# Patient Record
Sex: Female | Born: 1960 | Race: White | Hispanic: No
Health system: Southern US, Community
[De-identification: ages and names within clinical notes are randomized; demographics above are authoritative.]

---

## 2004-02-11 ENCOUNTER — Other Ambulatory Visit: Payer: Self-pay

## 2006-11-04 ENCOUNTER — Emergency Department: Payer: Self-pay | Admitting: Internal Medicine

## 2006-11-12 ENCOUNTER — Ambulatory Visit: Payer: Self-pay | Admitting: Internal Medicine

## 2006-12-03 ENCOUNTER — Inpatient Hospital Stay: Payer: Self-pay | Admitting: Internal Medicine

## 2006-12-03 ENCOUNTER — Other Ambulatory Visit: Payer: Self-pay

## 2006-12-12 ENCOUNTER — Ambulatory Visit: Payer: Self-pay | Admitting: Internal Medicine

## 2007-03-31 ENCOUNTER — Emergency Department: Payer: Self-pay | Admitting: Emergency Medicine

## 2007-05-04 ENCOUNTER — Inpatient Hospital Stay: Payer: Self-pay | Admitting: Internal Medicine

## 2007-05-25 ENCOUNTER — Observation Stay: Payer: Self-pay | Admitting: Internal Medicine

## 2007-05-25 ENCOUNTER — Other Ambulatory Visit: Payer: Self-pay

## 2007-05-30 ENCOUNTER — Emergency Department: Payer: Self-pay | Admitting: Emergency Medicine

## 2007-05-31 ENCOUNTER — Other Ambulatory Visit: Payer: Self-pay

## 2007-05-31 ENCOUNTER — Emergency Department: Payer: Self-pay | Admitting: Internal Medicine

## 2007-07-11 ENCOUNTER — Inpatient Hospital Stay: Payer: Self-pay | Admitting: Psychiatry

## 2007-08-03 ENCOUNTER — Other Ambulatory Visit: Payer: Self-pay

## 2007-08-03 ENCOUNTER — Emergency Department: Payer: Self-pay | Admitting: Emergency Medicine

## 2008-06-13 ENCOUNTER — Emergency Department: Payer: Self-pay | Admitting: Emergency Medicine

## 2008-07-25 ENCOUNTER — Emergency Department: Payer: Self-pay | Admitting: Emergency Medicine

## 2008-08-20 ENCOUNTER — Inpatient Hospital Stay: Payer: Self-pay | Admitting: Psychiatry

## 2008-11-05 ENCOUNTER — Emergency Department: Payer: Self-pay | Admitting: Internal Medicine

## 2009-08-17 ENCOUNTER — Ambulatory Visit: Payer: Self-pay | Admitting: Gastroenterology

## 2009-08-20 ENCOUNTER — Emergency Department (HOSPITAL_COMMUNITY): Admission: EM | Admit: 2009-08-20 | Discharge: 2009-08-20 | Payer: Self-pay | Admitting: Emergency Medicine

## 2009-10-03 ENCOUNTER — Inpatient Hospital Stay: Payer: Self-pay | Admitting: Psychiatry

## 2009-10-16 ENCOUNTER — Inpatient Hospital Stay: Payer: Self-pay | Admitting: Psychiatry

## 2009-10-23 ENCOUNTER — Ambulatory Visit: Payer: Self-pay | Admitting: Unknown Physician Specialty

## 2009-11-06 ENCOUNTER — Inpatient Hospital Stay: Payer: Self-pay | Admitting: Psychiatry

## 2009-11-13 ENCOUNTER — Ambulatory Visit: Payer: Self-pay | Admitting: Unknown Physician Specialty

## 2009-12-06 ENCOUNTER — Emergency Department: Payer: Self-pay | Admitting: Emergency Medicine

## 2009-12-22 ENCOUNTER — Emergency Department: Payer: Self-pay | Admitting: Emergency Medicine

## 2010-02-08 ENCOUNTER — Inpatient Hospital Stay: Payer: Self-pay | Admitting: Internal Medicine

## 2010-02-11 ENCOUNTER — Inpatient Hospital Stay: Payer: Self-pay | Admitting: Psychiatry

## 2010-03-13 ENCOUNTER — Inpatient Hospital Stay: Payer: Self-pay | Admitting: Psychiatry

## 2010-04-02 ENCOUNTER — Inpatient Hospital Stay: Payer: Self-pay | Admitting: Unknown Physician Specialty

## 2010-04-26 ENCOUNTER — Inpatient Hospital Stay: Payer: Self-pay | Admitting: Psychiatry

## 2010-07-01 ENCOUNTER — Inpatient Hospital Stay: Payer: Self-pay | Admitting: Psychiatry

## 2010-07-11 ENCOUNTER — Inpatient Hospital Stay: Payer: Self-pay | Admitting: Psychiatry

## 2010-07-25 ENCOUNTER — Inpatient Hospital Stay: Payer: Self-pay | Admitting: Psychiatry

## 2010-08-07 ENCOUNTER — Inpatient Hospital Stay: Payer: Self-pay | Admitting: Psychiatry

## 2010-08-23 ENCOUNTER — Inpatient Hospital Stay: Payer: Self-pay | Admitting: Psychiatry

## 2010-09-06 LAB — DIFFERENTIAL
Basophils Absolute: 0 10*3/uL (ref 0.0–0.1)
Basophils Relative: 0 % (ref 0–1)
Lymphocytes Relative: 49 % — ABNORMAL HIGH (ref 12–46)
Neutro Abs: 2.2 10*3/uL (ref 1.7–7.7)
Neutrophils Relative %: 33 % — ABNORMAL LOW (ref 43–77)

## 2010-09-06 LAB — URINALYSIS, ROUTINE W REFLEX MICROSCOPIC
Glucose, UA: NEGATIVE mg/dL
Ketones, ur: NEGATIVE mg/dL

## 2010-09-06 LAB — COMPREHENSIVE METABOLIC PANEL
Albumin: 3.4 g/dL — ABNORMAL LOW (ref 3.5–5.2)
BUN: 6 mg/dL (ref 6–23)
CO2: 23 mEq/L (ref 19–32)
Chloride: 108 mEq/L (ref 96–112)
Creatinine, Ser: 0.69 mg/dL (ref 0.4–1.2)
GFR calc non Af Amer: >60 mL/min (ref >60)
Glucose, Bld: 103 mg/dL — ABNORMAL HIGH (ref 70–99)
Total Bilirubin: 0.3 mg/dL (ref 0.3–1.2)

## 2010-09-06 LAB — CBC
HCT: 39 % (ref 36.0–46.0)
Hemoglobin: 13.4 g/dL (ref 12.0–15.0)
MCV: 88.6 fL (ref 78.0–100.0)
Platelets: 270 10*3/uL (ref 150–400)
WBC: 6.7 10*3/uL (ref 4.0–10.5)

## 2010-09-06 LAB — URINE MICROSCOPIC-ADD ON

## 2010-09-06 LAB — RAPID URINE DRUG SCREEN, HOSP PERFORMED
Barbiturates: NOT DETECTED
Benzodiazepines: POSITIVE — AB
Cocaine: NOT DETECTED

## 2011-07-20 ENCOUNTER — Ambulatory Visit: Payer: Self-pay | Admitting: Pain Medicine

## 2011-07-28 ENCOUNTER — Other Ambulatory Visit: Payer: Self-pay | Admitting: Pain Medicine

## 2011-07-28 LAB — CBC WITH DIFFERENTIAL/PLATELET
Eosinophil %: 7.1 %
HGB: 13.6 g/dL (ref 12.0–16.0)
Lymphocyte %: 28.6 %
MCH: 28.9 pg (ref 26.0–34.0)
Monocyte #: 0.4 10*3/uL (ref 0.0–0.7)
Monocyte %: 4.9 %
Neutrophil %: 59 %
Platelet: 358 10*3/uL (ref 150–440)
RBC: 4.72 10*6/uL (ref 3.80–5.20)
WBC: 9.2 10*3/uL (ref 3.6–11.0)

## 2011-07-28 LAB — SEDIMENTATION RATE: Erythrocyte Sed Rate: 28 mm/hr — ABNORMAL HIGH (ref 0–20)

## 2013-08-22 ENCOUNTER — Observation Stay: Payer: Self-pay | Admitting: Internal Medicine

## 2013-08-22 LAB — SALICYLATE LEVEL: SALICYLATES, SERUM: 6.6 mg/dL — AB

## 2013-08-22 LAB — COMPREHENSIVE METABOLIC PANEL
ALBUMIN: 3.4 g/dL (ref 3.4–5.0)
ALT: 22 U/L (ref 12–78)
ANION GAP: 3 — AB (ref 7–16)
AST: 28 U/L (ref 15–37)
Alkaline Phosphatase: 70 U/L
BUN: 8 mg/dL (ref 7–18)
Bilirubin,Total: 0.5 mg/dL (ref 0.2–1.0)
CALCIUM: 8.6 mg/dL (ref 8.5–10.1)
CHLORIDE: 109 mmol/L — AB (ref 98–107)
CREATININE: 0.71 mg/dL (ref 0.60–1.30)
Co2: 29 mmol/L (ref 21–32)
EGFR (African American): >60
Glucose: 92 mg/dL (ref 65–99)
OSMOLALITY: 279 (ref 275–301)
POTASSIUM: 3.5 mmol/L (ref 3.5–5.1)
SODIUM: 141 mmol/L (ref 136–145)
TOTAL PROTEIN: 7.4 g/dL (ref 6.4–8.2)

## 2013-08-22 LAB — DRUG SCREEN, URINE
Amphetamines, Ur Screen: NEGATIVE (ref ?–1000)
Barbiturates, Ur Screen: NEGATIVE (ref ?–200)
Benzodiazepine, Ur Scrn: NEGATIVE (ref ?–200)
Cannabinoid 50 Ng, Ur ~~LOC~~: NEGATIVE (ref ?–50)
Cocaine Metabolite,Ur ~~LOC~~: NEGATIVE (ref ?–300)
MDMA (ECSTASY) UR SCREEN: NEGATIVE (ref ?–500)
METHADONE, UR SCREEN: NEGATIVE (ref ?–300)
OPIATE, UR SCREEN: NEGATIVE (ref ?–300)
PHENCYCLIDINE (PCP) UR S: NEGATIVE (ref ?–25)
TRICYCLIC, UR SCREEN: NEGATIVE (ref ?–1000)

## 2013-08-22 LAB — URINALYSIS, COMPLETE
BILIRUBIN, UR: NEGATIVE
Bacteria: NONE SEEN
Glucose,UR: NEGATIVE mg/dL (ref 0–75)
Ketone: NEGATIVE
LEUKOCYTE ESTERASE: NEGATIVE
NITRITE: NEGATIVE
Ph: 6 (ref 4.5–8.0)
Protein: NEGATIVE
RBC,UR: 2 /HPF (ref 0–5)
Specific Gravity: 1.006 (ref 1.003–1.030)
Squamous Epithelial: 1
WBC UR: 1 /HPF (ref 0–5)

## 2013-08-22 LAB — CK TOTAL AND CKMB (NOT AT ARMC)
CK, TOTAL: 287 U/L — AB
CK-MB: 6 ng/mL — AB (ref 0.5–3.6)

## 2013-08-22 LAB — CBC
HCT: 48.1 % — ABNORMAL HIGH (ref 35.0–47.0)
HGB: 16.1 g/dL — AB (ref 12.0–16.0)
MCH: 30.6 pg (ref 26.0–34.0)
MCHC: 33.5 g/dL (ref 32.0–36.0)
MCV: 91 fL (ref 80–100)
PLATELETS: 213 10*3/uL (ref 150–440)
RBC: 5.27 10*6/uL — ABNORMAL HIGH (ref 3.80–5.20)
RDW: 13.5 % (ref 11.5–14.5)
WBC: 9.1 10*3/uL (ref 3.6–11.0)

## 2013-08-22 LAB — ETHANOL
Ethanol %: <0.003 % (ref 0.000–0.080)
Ethanol: <3 mg/dL

## 2013-08-22 LAB — TSH: THYROID STIMULATING HORM: 0.99 u[IU]/mL

## 2013-08-22 LAB — AMMONIA: Ammonia, Plasma: 20 mcmol/L (ref 11–32)

## 2013-08-22 LAB — TROPONIN I: Troponin-I: <0.02 ng/mL

## 2013-08-22 LAB — ACETAMINOPHEN LEVEL

## 2013-08-23 LAB — PROTIME-INR
INR: 1.1
Prothrombin Time: 14.1 secs (ref 11.5–14.7)

## 2013-08-23 LAB — TSH: THYROID STIMULATING HORM: 0.498 u[IU]/mL

## 2013-08-23 LAB — COMPREHENSIVE METABOLIC PANEL
ALT: 20 U/L (ref 12–78)
AST: 32 U/L (ref 15–37)
Albumin: 3.1 g/dL — ABNORMAL LOW (ref 3.4–5.0)
Alkaline Phosphatase: 69 U/L
Anion Gap: 3 — ABNORMAL LOW (ref 7–16)
BUN: 7 mg/dL (ref 7–18)
Bilirubin,Total: 0.6 mg/dL (ref 0.2–1.0)
CO2: 29 mmol/L (ref 21–32)
CREATININE: 0.61 mg/dL (ref 0.60–1.30)
Calcium, Total: 8.3 mg/dL — ABNORMAL LOW (ref 8.5–10.1)
Chloride: 110 mmol/L — ABNORMAL HIGH (ref 98–107)
EGFR (African American): >60
EGFR (Non-African Amer.): >60
Glucose: 86 mg/dL (ref 65–99)
OSMOLALITY: 280 (ref 275–301)
POTASSIUM: 3.1 mmol/L — AB (ref 3.5–5.1)
SODIUM: 142 mmol/L (ref 136–145)
TOTAL PROTEIN: 6.8 g/dL (ref 6.4–8.2)

## 2013-08-23 LAB — CBC WITH DIFFERENTIAL/PLATELET
BASOS ABS: 0.1 10*3/uL (ref 0.0–0.1)
Basophil %: 0.5 %
EOS PCT: 1.2 %
Eosinophil #: 0.1 10*3/uL (ref 0.0–0.7)
HCT: 45.1 % (ref 35.0–47.0)
HGB: 15.5 g/dL (ref 12.0–16.0)
LYMPHS PCT: 22.5 %
Lymphocyte #: 2.2 10*3/uL (ref 1.0–3.6)
MCH: 31.5 pg (ref 26.0–34.0)
MCHC: 34.5 g/dL (ref 32.0–36.0)
MCV: 91 fL (ref 80–100)
Monocyte #: 0.7 x10 3/mm (ref 0.2–0.9)
Monocyte %: 7.3 %
NEUTROS PCT: 68.5 %
Neutrophil #: 6.8 10*3/uL — ABNORMAL HIGH (ref 1.4–6.5)
PLATELETS: 189 10*3/uL (ref 150–440)
RBC: 4.94 10*6/uL (ref 3.80–5.20)
RDW: 13.4 % (ref 11.5–14.5)
WBC: 9.9 10*3/uL (ref 3.6–11.0)

## 2013-08-23 LAB — MAGNESIUM: MAGNESIUM: 1.8 mg/dL

## 2013-08-27 LAB — CULTURE, BLOOD (SINGLE)

## 2014-07-22 ENCOUNTER — Ambulatory Visit: Payer: Self-pay | Admitting: Obstetrics and Gynecology

## 2014-08-08 ENCOUNTER — Ambulatory Visit: Payer: Self-pay | Admitting: Obstetrics and Gynecology

## 2014-10-04 NOTE — H&P (Signed)
PATIENT NAME:  Cheryl Gibson, Cheryl Gibson DATE OF BIRTH:  04-18-1961  DATE OF ADMISSION:  08/22/2013  PRIMARY CARE PHYSICIAN: [facility]  REQUESTING PHYSICIAN: Janalyn Harderavid Kaminski, MD.   CHIEF COMPLAINT: Altered mental status.   HISTORY OF PRESENT ILLNESS: The patient is a 54 year old female with a known history of alcoholism, hypertension, GERD, anxiety and depression who is being admitted for altered mental status. The patient was brought into the Emergency Department from home via EMS by her son who felt the patient overdosed on some medication and had been not acting normally. As per ED physician, the patient was found like this earlier in the morning by son. When I went to see the patient, she was very altered, was moving around in the bed, and kept repeating "I don't know" and the only thing she could tell me was she is at [facility] but nothing else. I tried to reach her son with all the numbers listed, but was not reachable, and the number seems wrong.  PAST MEDICAL HISTORY: 1.  COPD. 2.  Anxiety. 3.  Depression.  4.  Alcoholic cirrhosis.  5.  Anemia of chronic disease. 6.  Hypothyroidism.  7.  Hepatic encephalopathy.  8.  Cholelithiasis.   ALLERGIES: PENICILLIN AND AUGMENTIN.   SOCIAL HISTORY: She is married but per record was looking for a divorce. She lives with her son. She is disabled and listed orthopedist is Dr. Alleen BorneSamuel Adams at [facility].  PAST PSYCHIATRIC HISTORY: Multiple psychiatric hospitalizations with completion of [facility] program. As per psychiatric note, the patient has had a history of complicated detox and suicide attempts by medication overdose in the past.   REVIEW OF SYSTEMS: Unobtainable secondary to her mental status.   HOME MEDICATIONS: 1.  Zoloft 100 mg 1-1/2 tablets daily.  2.  Gabapentin 600 mg 2 tablets p.o. 3 times a day. 3.  Ibuprofen 800 mg p.o. 3 times a day as needed. 4.  Lasix 20 mg p.o. daily. 5.  Levoxine 88 mcg p.o. daily. 6.   Lorazepam 0.5 mg p.o. 3 times a day and 1 tablet of 1 mg at bedtime. 7.  Multivitamin once daily.  8.  Nadolol 20 mg p.o. daily.  9.  Potassium chloride 10 mEq p.o. daily.  10.  Tramadol 50 mg 1 to 2 tablets p.o. daily.   PHYSICAL EXAMINATION: VITAL SIGNS: Temperature 97.6, heart rate 49 per minute, respirations 18 per minute, blood pressure 117/69 mmHg, and she is saturating 96% on room air.  GENERAL: The patient is a 54 year old female, lying in the bed, confused.  EYES: Pupils equal, round, and reactive to light and accommodation. No scleral icterus. Extraocular muscles intact.  HENT: Head atraumatic, normocephalic. Nasopharynx clear.  NECK: Supple. No jugular venous distention. No thyroid enlargement or tenderness.  LUNGS: Clear to auscultation bilaterally. No wheezes, rales, rhonchi, or crepitation.  HEART: S1, S2 normal. No murmurs, gallops, or rubs.  ABDOMEN: Soft, nontender, nondistended. Bowel sounds present. No hepatosplenomegaly or mass. EXTREMITIES: No pedal edema, cyanosis or clubbing.  NEUROLOGIC: Cranial nerves II through XII intact. Muscle strength 5/5 in all extremities. Sensation intact. She seems confused and is not oriented. PSYCHIATRIC: The patient is alert, but not oriented. She could only tell me that she is at [facility], otherwise she keeps repeating "I don't know."   DIAGNOSTIC DATA: Normal BMP. Normal liver function tests. Normal TSH. Negative urine tox. Normal CBC. Negative UA. Serum Tylenol level than 2. Salicylate level 6.6.   ABG showed pH of 7.41,  pCO2 47, pO2 66, and bicarb 29.8.   Chest x-ray in the ED showed no acute cardiopulmonary disease.   CT scan of the head without contrast was negative for any acute pathology.  EKG showed no acute cardiopulmonary disease, sinus tachycardia seen.   IMPRESSION AND PLAN: 1.  Acute toxic metabolic encephalopathy, likely due to drug overdose. Looking back at her records it looks like she was followed by pain  management, Dr. Laban Emperor, and was fired from his practice in February of 2013 due to inconsistency in reporting her pain medication/narcotics with a urine drug screen which was not matching. At this point, I am not able to reach her family member with the listed phone numbers. The most common thought with her having no fever and  no obvious labs would be possible drug overdose. We will put her on observation and consult neurology. Will hold off MRI of the brain as her CT is negative. She does not have any focal examination. This is very likely from drug overdose. Could be from psychiatry medication. We will hold off her psychiatric medication and get psychiatry evaluation for resumption of her psych medication as they feel appropriate. 2.  Hypertension. We will continue her home medication. Her blood pressure is stable.  3.  Hypothyroidism. Her TSH is normal. Continue Synthroid.  4.  Hyperlipidemia. We will continue statin.  CODE STATUS: FULL.  TOTAL TIME TAKING CARE OF THIS PATIENT: 55 minutes.   ____________________________ Ellamae Sia. Sherryll Burger, MD vss:sb D: 08/22/2013 15:51:18 ET T: 08/22/2013 16:48:22 ET JOB#: [id number]  cc: Jad Johansson S. Sherryll Burger, MD, <Dictator> [facility] Alleen Borne, MD ([facility]) Guinevere Ferrari ([facility]) Ellamae Sia [facility] MD ELECTRONICALLY SIGNED 08/23/2013 16:28

## 2014-10-04 NOTE — Discharge Summary (Signed)
PATIENT NAME:  Cheryl Gibson, Cheryl Gibson MR#:  [id number] DATE OF BIRTH:  1960-07-29  DATE OF ADMISSION:  08/22/2013 DATE OF DISCHARGE:  08/23/2013  ADMITTING DIAGNOSIS:  Altered mental status.   DISCHARGE DIAGNOSES: 1.  Metabolic encephalopathy due to taking unknown dose of baclofen.  2.  Hypokalemia.  3.  Chronic obstructive pulmonary disease with wheezing, ongoing tobacco abuse.  4.  Anxiety, depression. 5.  Alcoholic liver cirrhosis.  Denies alcohol use over the past three years.  6.  Hepatic encephalopathy, none on this admission.  7.  Anemia of chronic disease.  8.  Cholelithiasis.   DISCHARGE CONDITION:  Stable.   DISCHARGE MEDICATIONS:  The patient is to continue:  1.  Nadolol 20 mg by mouth daily.  2.  Lasix 20 mg by mouth daily.  3.  Levothyroxine 88 mcg by mouth daily.  4.  Lorazepam 1 mg half tablet 3 times daily and 1 tablet at bedtime.  5.  Potassium chloride 10 mEq by mouth once daily.  6.  Tramadol 50 mg 1 to 2 tablets daily as needed.  7.  Zoloft 100 mg 1-1/2 tablets once daily.  8.  Multivitamins once daily.  9.  Ibuprofen 800 mg 3 times daily as needed.  10.  Gabapentin 1200 mg 3 times daily.   HOME OXYGEN:  None.   DIET:  Low salt, regular consistency.   ACTIVITY LIMITATIONS:  As tolerated.    FOLLOWUP APPOINTMENTS:  [facility] in two days after discharge.   CONSULTANTS:  Neurologist, Dr. Sherryll Burger and psychiatrist Dr. Guss Bunde.   RADIOLOGIC STUDIES:  Chest x-ray, portable single view 08/22/2013 revealed no active disease.  Repeated chest x-ray PA and lateral 08/23/2013 revealed normal exam.  CT scan of head without contrast 08/22/2013 was normal.    HOSPITAL COURSE:  The patient is a 54 year old Caucasian female with past medical history significant for history of anxiety, depression, alcoholism, who presented to the hospital with altered mental status.  Please refer to Dr. Margaretmary Eddy admission note on 08/22/2013.  On arrival to the hospital, the patient was  poorly arousable.  Her vital signs however are normal with heart rate low at 49, temperature was 97.6, respiratory rate was 18, blood pressure 117/69, saturation was 96% on room air.  Physical exam was unremarkable.  The patient was however able to move her extremities; however, she was confused and was repeating to all questions answers "I don't know."  She was evaluated by neurologist who felt that the patient's encephalopathy was of unclear etiology.  He felt it could have been some medication abuse.  He recommended to get a urine drug screen which was negative for any abnormalities.  He recommended to avoid anticholinergics as well as older generation, antipsychotics, antihistamines.  He recommended to get ABGs.  ABGs were performed on admission and they revealed mild elevation of pCO2 47, however pH was normal at 7.41.  The patient was at that time noted to be somewhat a little hypoxic with pO2 was 66, however the patient's O2 sats were normal at 96.7 on room air.  Her labs otherwise showed normal BMP, normal ammonia level of 20.  Alcohol level less than 3.  The patient's liver enzymes were normal.  The patient's CK total was mildly elevated at 287.  MB fraction was 6.0, troponin was less than 0.02.  TSH was normal at 0.99.  As mentioned above urine drug screen was negative.  White blood cell count was normal at 9.1, hemoglobin was 16.1 and platelet  count was 213.  Repeated CBC was within normal limits with white blood cell count 9.9, hemoglobin level of 15.5, platelet count 189.  Absolute neutrophil count was 6.8.  Coagulation panel within normal limits with Pro Times 14.1 and INR was 1.1.  Blood cultures taken on 08/22/2013 did not show any growth.  Urinalysis was unremarkable.  Salicylate level was elevated at 6.6.  Acetaminophen level less than 2.  Chest x-ray as mentioned above as well as a head CT were within normal limits.  The patient was evaluated by psychiatrist and psychiatrist felt the patient is  stable to be discharged and it did not require any hospitalization.  She is being discharged today on 08/23/2013 with the above-mentioned medications and follow-up.   Her vital signs on the day of discharge, temperature was 98.2, pulse was 54, respiratory rate was 19, blood pressure 158/81, saturation was 96% on room air at rest.   TIME SPENT:  40 minutes.     ____________________________ Katharina Caperima Emiel Kielty, MD rv:ea D: 08/23/2013 16:09:26 ET T: 08/24/2013 04:16:08 ET JOB#: [id number]  cc: Katharina Caperima Sianni Cloninger, MD, <Dictator> [facility] Jaslynne Dahan MD ELECTRONICALLY SIGNED 09/11/2013 23:05

## 2014-10-04 NOTE — Consult Note (Signed)
Referring Physician:  Remer Macho :   Reason for Consult: Admit Date: 22-Aug-2013  Chief Complaint: ams   History of Present Illness: History of Present Illness:   History from chart review - as pt did not provide any history and no family available at bed side.   The patient is a 54 year old female with a known history of alcoholism, hypertension, GERD, anxiety and depression who is being admitted for altered mental status. The patient was brought into the Emergency Department from home via EMS by her son who felt the patient overdosed on some medication and had been not acting normally. As per ED physician, the patient was found like this earlier in the morning by son. When I went to see the patient, she was very altered, was moving around in the bed, and kept repeating "I don?t know" and the only thing she could tell me was she is at [facility] but nothing else. I tried to reach her son with all the numbers listed, but was not reachable, and the number seems wrong. MEDICAL HISTORY: COPD. Anxiety. Depression.  Alcoholic cirrhosis.  Anemia of chronic disease. Hypothyroidism.  Hepatic encephalopathy.  Cholelithiasis.  PENICILLIN AND AUGMENTIN.  HISTORY: She is married but per record was looking for a divorce. She lives with her son. She is disabled and listed orthopedist is Dr. Dellwood Cellar at [facility]. PSYCHIATRIC HISTORY: Multiple psychiatric hospitalizations with completion of [facility] program. As per psychiatric note, the patient has had a history of complicated detox and suicide attempts by medication overdose in the past.   ROS:  Review of Systems   unobtainable  Allergies:  Augmentin: Unknown  PCN: Unknown  Vital Signs: **Vital Signs.:   12-Mar-15 16:40  Vital Signs Type Admission  Temperature Temperature (F) 98.3  Celsius 36.8  Temperature Source oral  Pulse Pulse 61  Respirations Respirations 19  Systolic BP Systolic BP 161  Diastolic BP (mmHg) Diastolic BP (mmHg)  88  Mean BP 111  Pulse Ox % Pulse Ox % 97  Pulse Ox Activity Level  At rest  Oxygen Delivery Room Air/ 21 %   EXAM: middle age AAF lying in bed, intially did not respond my verbal commands, keeps closing her eyes, short attention span, makes circular head movements, says "humm" repeatedly.  after multiple attempts - pt set up on the side of the bed. (seemed like moved all extremities symmetrically), pt did not keep her eyes open to reliablly check her pupillar responses but they seemd symmetric, her face was symmetric, did not open her mouth, felt her neck strenght was good. - no neck stiffness  Tone was normal, I did not see any asterixis couldn't check sensory, co-ordination or gait exam reliablly, other portions of neurological exam was not possible due to pt's behavioral state.  Lab Results: Thyroid:  12-Mar-15 10:25   Thyroid Stimulating Hormone 0.99 (0.45-4.50 (International Unit)  ----------------------- Pregnant patients have  different reference  ranges for TSH:  - - - - - - - - - -  Pregnant, first trimetser:  0.36 - 2.50 uIU/mL)  Hepatic:  12-Mar-15 10:25   Bilirubin, Total 0.5  Alkaline Phosphatase 70 (45-117 NOTE: New Reference Range 05/03/13)  SGPT (ALT) 22  SGOT (AST) 28  Total Protein, Serum 7.4  Albumin, Serum 3.4  General Ref:  12-Mar-15 10:25   Acetaminophen, Serum < 2 (10-30 POTENTIALLY TOXIC:  > 200 mcg/mL  > 50 mcg/mL at 12 hr after  ingestion  > 300 mcg/mL at 4 hr after  ingestion)  Salicylates, Serum  6.6 (0.0-2.8 Therapeutic 2.8-20.0 mg/dL Toxic >30.0 mg/dL)  Routine Chem:  12-Mar-15 10:25   Glucose, Serum 92  BUN 8  Creatinine (comp) 0.71  Sodium, Serum 141  Potassium, Serum 3.5  Chloride, Serum  109  CO2, Serum 29  Calcium (Total), Serum 8.6  Osmolality (calc) 279  eGFR (African American) >60  eGFR (Non-African American) >60 (eGFR values <60m/min/1.73 m2 may be an indication of chronic kidney disease (CKD). Calculated eGFR is  useful in patients with stable renal function. The eGFR calculation will not be reliable in acutely ill patients when serum creatinine is changing rapidly. It is not useful in  patients on dialysis. The eGFR calculation may not be applicable to patients at the low and high extremes of body sizes, pregnant women, and vegetarians.)  Result Comment MET PANEL - Slight hemolysis, interpret results with  - caution.  Result(s) reported on 22 Aug 2013 at 11:32AM.  Anion Gap  3  Ethanol, S. < 3  Ethanol % (comp) < 0.003 (Result(s) reported on 22 Aug 2013 at 11:32AM.)    12:25   Ammonia, Plasma 20 (Result(s) reported on 22 Aug 2013 at 01:05PM.)  Urine Drugs:  [id number]  Tricyclic Antidepressant, Ur Qual (comp) NEGATIVE (Result(s) reported on 22 Aug 2013 at 11:46AM.)  Amphetamines, Urine Qual. NEGATIVE  MDMA, Urine Qual. NEGATIVE  Cocaine Metabolite, Urine Qual. NEGATIVE  Opiate, Urine qual NEGATIVE  Phencyclidine, Urine Qual. NEGATIVE  Cannabinoid, Urine Qual. NEGATIVE  Barbiturates, Urine Qual. NEGATIVE  Benzodiazepine, Urine Qual. NEGATIVE (----------------- The URINE DRUG SCREEN provides only a preliminary, unconfirmed analytical test result and should not be used for non-medical  purposes.  Clinical consideration and professional judgment should be  applied to any positive drug screen result due to possible interfering substances.  A more specific alternate chemical method must be used in order to obtain a confirmed analytical result.  Gas chromatography/mass spectrometry (GC/MS) is the preferred confirmatory method.)  Methadone, Urine Qual. NEGATIVE  Cardiac:  12-Mar-15 10:25   CK, Total  287 (26-192 NOTE: NEW REFERENCE RANGE  07/15/2013)  CPK-MB, Serum  6.0 (Result(s) reported on 22 Aug 2013 at 11:41AM.)  Troponin I < 0.02 (0.00-0.05 0.05 ng/mL or less: NEGATIVE  Repeat testing in 3-6 hrs  if clinically indicated. >0.05 ng/mL: POTENTIAL  MYOCARDIAL INJURY. Repeat   testing in 3-6 hrs if  clinically indicated. NOTE: An increase or decrease  of 30% or more on serial  testing suggests a  clinically important change)  Routine UA:  12-Mar-15 10:41   Color (UA) Yellow  Clarity (UA) Clear  Glucose (UA) Negative  Bilirubin (UA) Negative  Ketones (UA) Negative  Specific Gravity (UA) 1.006  Blood (UA) 2+  pH (UA) 6.0  Protein (UA) Negative  Nitrite (UA) Negative  Leukocyte Esterase (UA) Negative (Result(s) reported on 22 Aug 2013 at 11:16AM.)  RBC (UA) 2 /HPF  WBC (UA) 1 /HPF  Bacteria (UA) NONE SEEN  Epithelial Cells (UA) 1 /HPF (Result(s) reported on 22 Aug 2013 at 11:16AM.)  Routine Hem:  12-Mar-15 10:25   WBC (CBC) 9.1  RBC (CBC)  5.27  Hemoglobin (CBC)  16.1  Hematocrit (CBC)  48.1  Platelet Count (CBC) 213 (Result(s) reported on 22 Aug 2013 at 11:04AM.)  MCV 91  MCH 30.6  MCHC 33.5  RDW 13.5   Radiology Impression: Radiology Impression: CT head - neg   Impression/Recommendations: Recommendations:   1) Encephalopathy  of unkonwn etiology without focal neurological defecit, or signs  of meningitis.labs reviewed and not bad enough to justify her current mental status. Noted previous history with pain medications issues.CT head OK - it will be very hard to do MRI brain due to her constant movements.agree with psych eval - if no improvement with psychotroic medication washout - will reconsider MRI brain.will consider EEG if no improvementconsider Vit B12, ammonia, RPR. Poor pain control can cause confusional state in patients with poor cognitive reserve.Please avoid anticholinergics, older generation anti-psychotics, anti-histaminics etc.and hypercarbia can also cause acute encephalopathy. (neg ABG) Consider placing nicotine patch in smokers.give multivitamin, minerals, THIAMINE 100 mg, if suspect use of alcoholism or malnutrition from any cause. will follow remotely, call us with questions.  Electronic Signatures: Ray Church  (MD)  (Signed 12-Mar-15 19:39)  Authored: REFERRING PHYSICIAN, Consult, History of Present Illness, Review of Systems, ALLERGIES, NURSING VITAL SIGNS, Physical Exam-, LAB RESULTS, RADIOLOGY RESULTS, Recommendations   Last Updated: 12-Mar-15 19:39 by Ray Church (MD)
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
## 2014-10-06 LAB — SURGICAL PATHOLOGY

## 2014-10-12 NOTE — Op Note (Signed)
PATIENT NAME:  Cheryl Gibson, Cheryl Gibson MR#:  [id number] DATE OF BIRTH:  May 31, 1961  DATE OF PROCEDURE:  08/08/2014  PREOPERATIVE DIAGNOSIS: Persistent postmenopausal bleeding.   POSTOPERATIVE DIAGNOSES: 1. Persistent postmenopausal bleeding.  2. Endometrial polyp.  3. Endometrial fibroids.   PROCEDURE:Total vaginal hysterectomy with bilateral salpingectomy.   SURGEON: Christeen DouglasBethany  Gracen Southwell, MD   ASSISTANT: Jennell Cornerhomas Schermerhorn, MD   ANESTHESIA: General.   ESTIMATED BLOOD LOSS: 50 mL.   URINE OUTPUT: 400 mL.   INTRAVENOUS FLUIDS: 1 liter.   SPECIMENS: Uterus, cervix, bilateral tubes.   COMPLICATIONS: None.   INDICATION FOR PROCEDURE: [name] is a 54 year old gravida 4, para 94 female with a history of postmenopausal bleeding for the last 2 to 3 years. An endometrial biopsy revealed no hyperplasia or malignancy. However, her endometrial stripe did measure 8 mm. The patient desired definitive management after discussion of more conservative options and was consented to proceed with total vaginal hysterectomy. The risks and benefits of this procedure were discussed with the patient and her family.   DESCRIPTION OF PROCEDURE: The patient was taken to the Operating Room, where she was identified by name and birthdate. She was placed on the operating table in a supine position and general anesthesia was induced without difficulty. She was then placed in dorsal lithotomy position using candy cane stirrups and prepped and draped in the usual sterile fashion. A formal timeout procedure was performed with all team members present and in agreement. Prior to the start of the procedure, the patient did receive prophylactic antibiotics using Ancef 2 grams.   A weighted speculum was placed in the vagina, and a Deaver was used to displace the bladder anteriorly, Thyroid forceps were used to grasp the anterior and posterior lip of the cervix. The cervical vaginal junction was identified and the peritoneum was entered  posteriorly sharply using Mayo scissors, after injection of 6 mL of dilute Pitressin 20 units in 30 mL sodium chloride.   A long weighted speculum was then placed in the posterior cul-de-sac. Uterosacral ligaments were clamped and Haney tied and tagged for future identification. Throughout the procedure, all sutures were Haney tied except as noted.  In a similar fashion, the cardinal ligament was grasped, clamped, cut and tied. A sharp Bovie cautery was used to incise the anterior cervix at the cervicovaginal junction and the bladder was bluntly dissected away at the cervix. Several more bites were taken along both lateral edges of the uterus before entry into the anterior peritoneum was accomplished. The Foley was palpated and no bladder injury was evident at this time.   The posterior fundus was then brought into visualization and the uterus was cored out using sharp Mayo scissors The left cornual was able to be transverse in its entirety. This was cut and tied. In a similar fashion, the right cornual was identified and the utero-ovarian ligament was able to be clamped, cut and tied. Several more bites were necessary to complete the right dissection of the uterus from the pelvic sidewall. This was carefully done in several bites until the fundus was finally able to be transected. The uterus was removed in its entirety. Evaluation of the right adnexa revealed and easily reachable fallopian tube. This was clamped and stick tied across. The left tube was then able to be identified and clamped as the suture was being thrown, however, part of the tube did tear away and about 30 minutes were spent assuring hemostasis on the left mesosalpinx after the tube was removed. 2-0 Vicryl  suture on an SH needle was for this purpose and a running stitch was placed along the mesosalpinx. Examination of both pedicles revealed bleeding at the posterior colpotomy, but both adnexa were dry.   A 2-0 PDS suture was used to  pursestring the anterior peritoneum circumferentially and the sponge stick was carefully removed as the pursestring was drawn shut to assure no bowel was caught in this pursestring. The PDS was tied and cut. The vaginal cuff was then closed in a running fashion using 0 Vicryl suture with the uterosacral ligaments tied in the midline behind the vaginal cuff. The posterior peritoneum had been tied to the posterior vaginal cuff at the start of the procedure. The suture was now cut as well. At the close of the procedure, there was no evident bleeding. The vaginal apex was supported with the uterosacral, and the patient tolerated the procedure well.   Plan to keep the patient overnight for monitoring as usual protocol with discharge home in the morning. General anesthesia was reversed without difficulty and the patient is stable in recovery.   ____________________________ Cline Cools, MD beb:ap D: 08/08/2014 11:52:44 ET T: 08/08/2014 23:13:01 ET JOB#: [id number]  cc: Cline Cools, MD, <Dictator> Cline Cools MD ELECTRONICALLY SIGNED 08/22/2014 7:39

## 2015-09-15 IMAGING — CR DG CHEST 2V
1 series · 3 of 3 positions shown · non-contrast
Comparison: 08/22/2013

CLINICAL DATA: Altered mental status

EXAM:
CHEST  2 VIEW

[Series 1: x chest ap · 0.14mm/px · 3 of 3 slices shown]
[im 1/3]
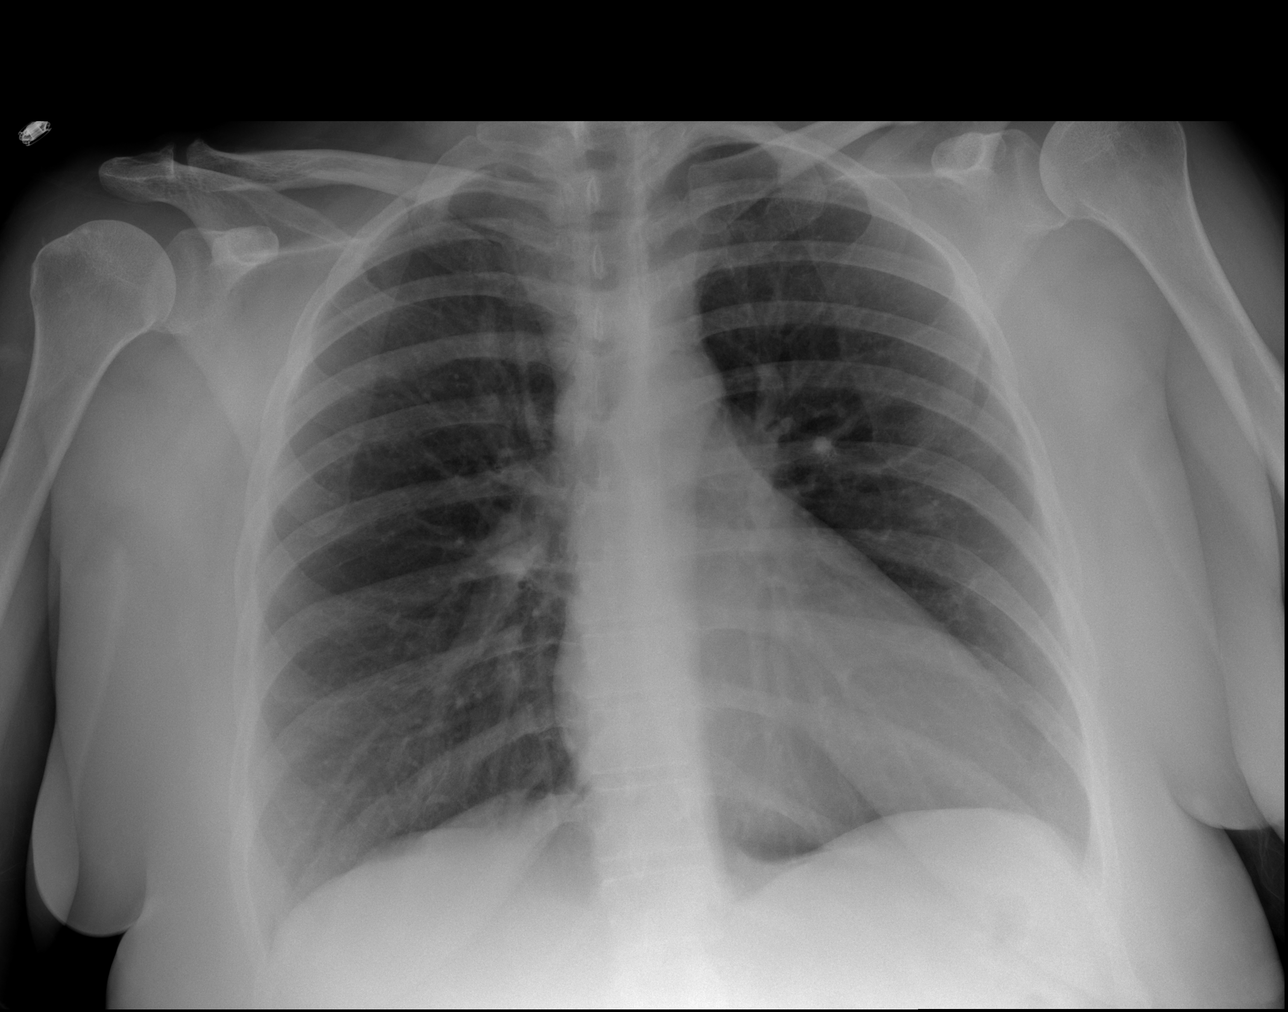
[im 2/3]
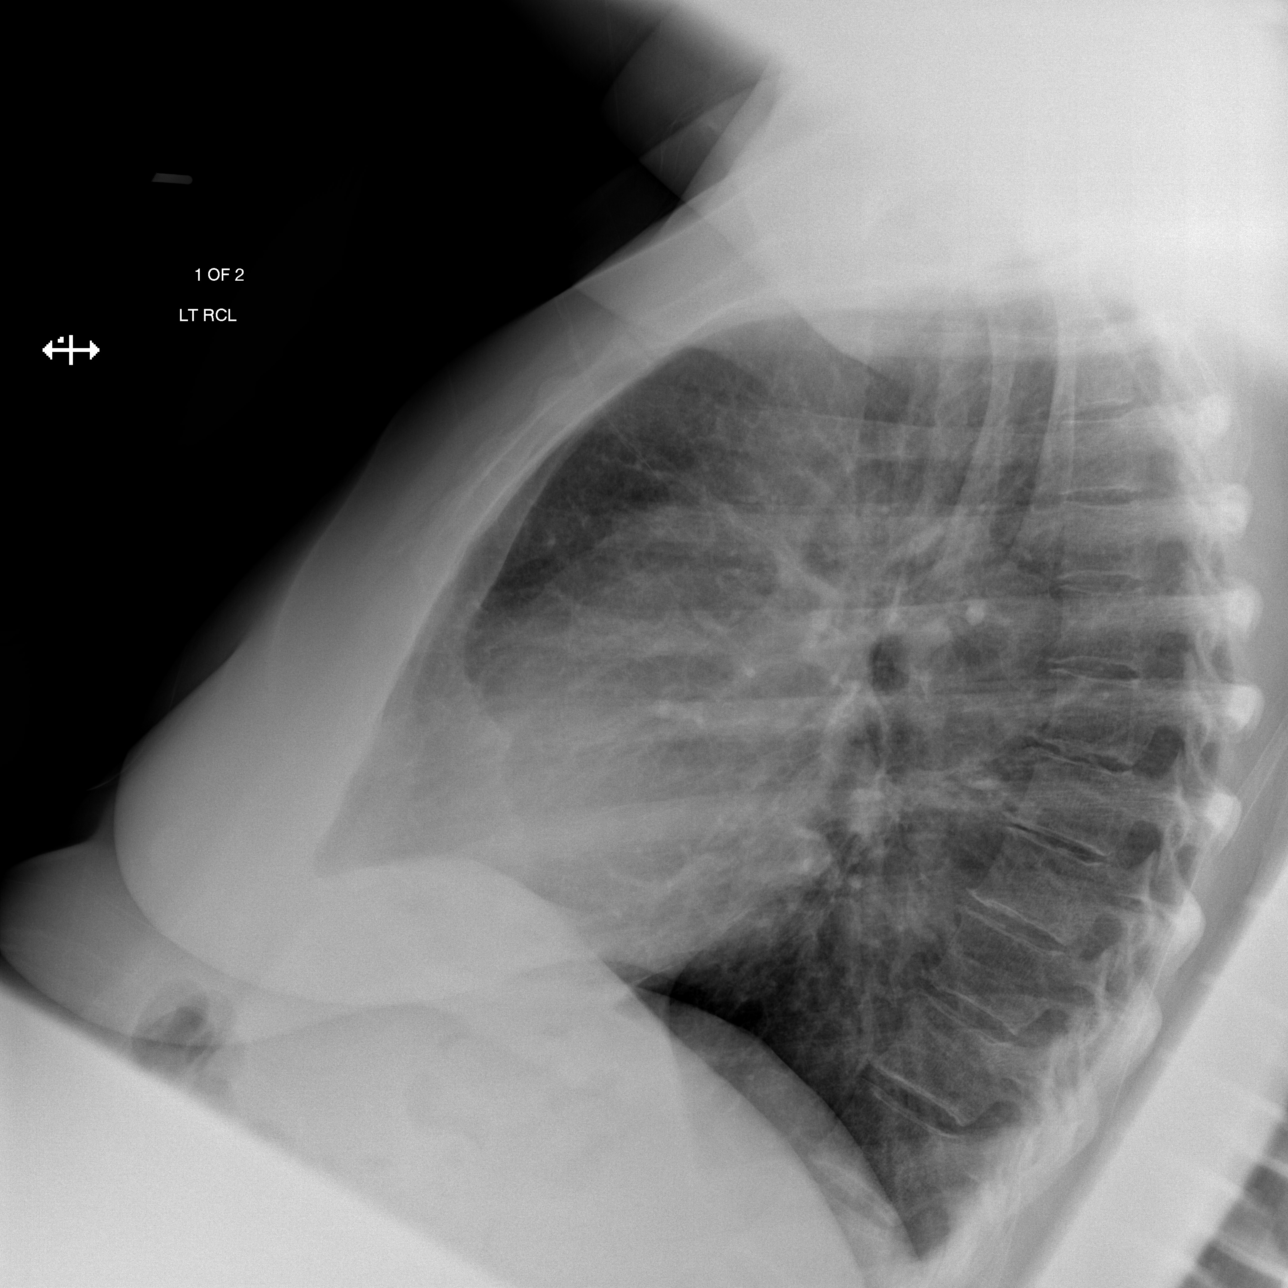
[im 3/3]
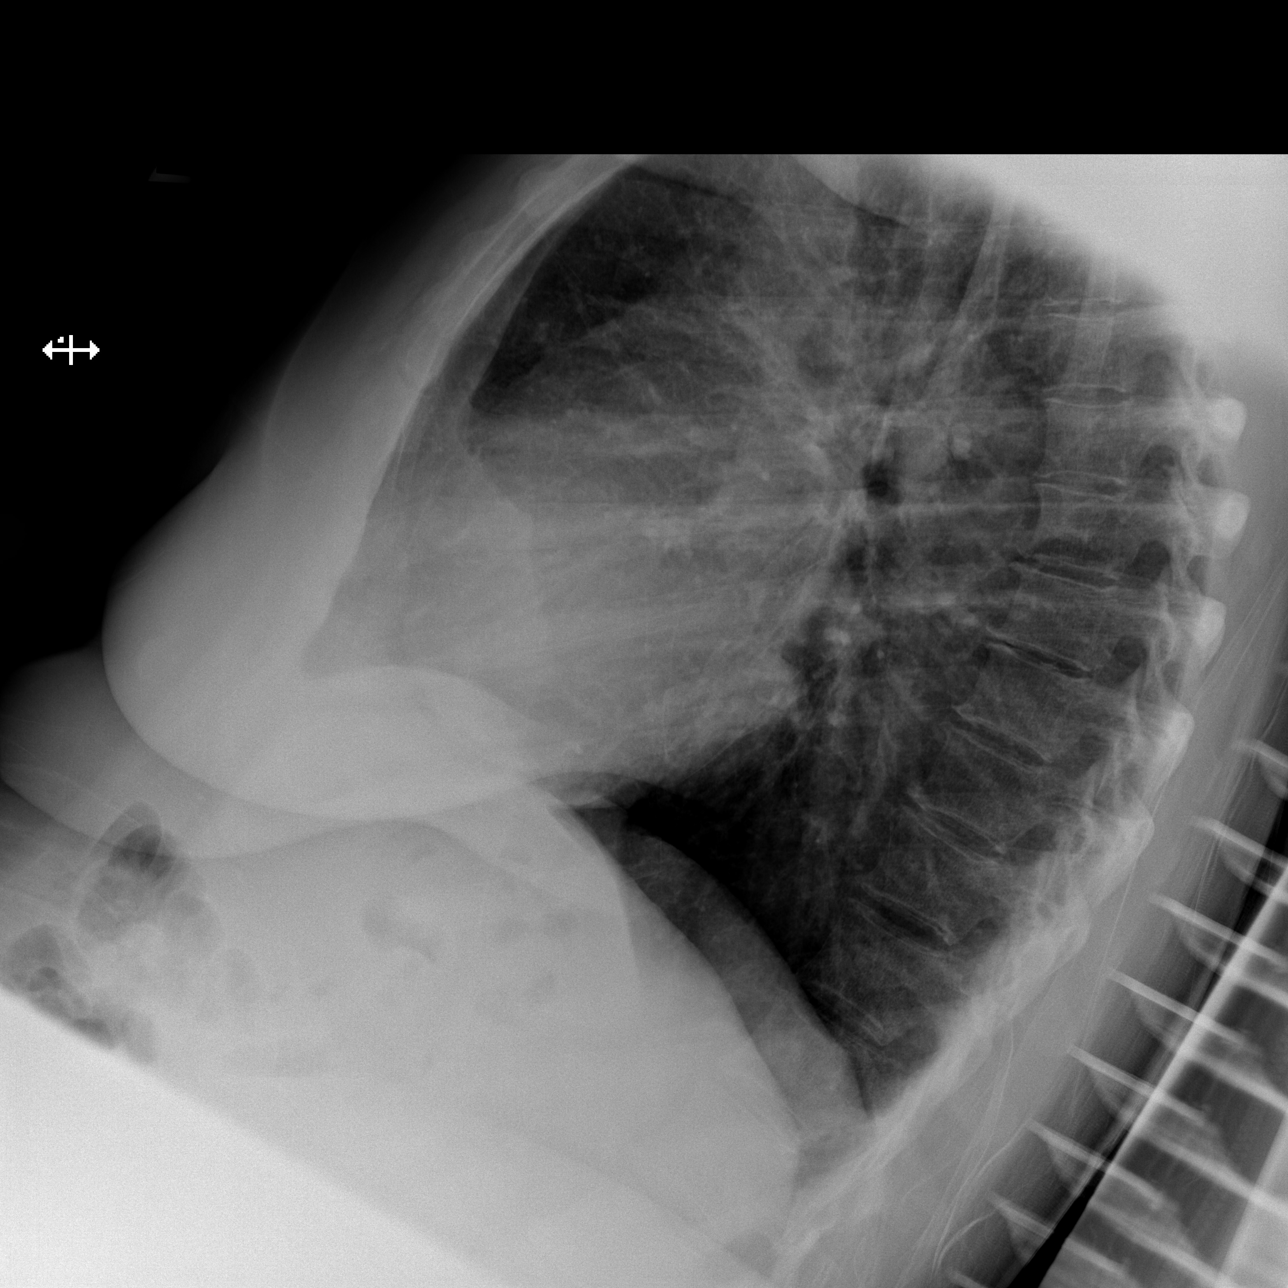

[3 of 3 positions shown; findings below may reference images not displayed]

FINDINGS: Normal heart size, mediastinal contours, and pulmonary vascularity.

Lungs clear.

Bones unremarkable.

No pneumothorax.
IMPRESSION: Normal exam.

## 2017-01-04 ENCOUNTER — Other Ambulatory Visit: Payer: Self-pay | Admitting: Emergency Medicine

## 2017-01-04 ENCOUNTER — Other Ambulatory Visit (HOSPITAL_COMMUNITY): Payer: Self-pay | Admitting: Emergency Medicine

## 2017-01-04 DIAGNOSIS — K7030 Alcoholic cirrhosis of liver without ascites: Secondary | ICD-10-CM

## 2017-01-17 ENCOUNTER — Ambulatory Visit: Payer: Self-pay

## 2017-01-31 ENCOUNTER — Ambulatory Visit
Admission: RE | Admit: 2017-01-31 | Discharge: 2017-01-31 | Disposition: A | Discharge status: Home / Self Care | Payer: Medicare Other | Source: Ambulatory Visit | Attending: Emergency Medicine | Admitting: Emergency Medicine

## 2017-12-28 ENCOUNTER — Other Ambulatory Visit: Payer: Self-pay | Admitting: Emergency Medicine

## 2017-12-28 DIAGNOSIS — K7030 Alcoholic cirrhosis of liver without ascites: Secondary | ICD-10-CM

## 2018-01-08 ENCOUNTER — Ambulatory Visit: Payer: Medicare Other

## 2018-01-16 ENCOUNTER — Ambulatory Visit: Payer: Medicare Other

## 2018-01-17 ENCOUNTER — Ambulatory Visit: Payer: Medicare Other

## 2018-01-24 ENCOUNTER — Ambulatory Visit
Admission: RE | Admit: 2018-01-24 | Discharge: 2018-01-24 | Disposition: A | Discharge status: Home / Self Care | Payer: Medicare Other | Source: Ambulatory Visit | Attending: Emergency Medicine | Admitting: Emergency Medicine

## 2018-01-24 ENCOUNTER — Encounter (INDEPENDENT_AMBULATORY_CARE_PROVIDER_SITE_OTHER): Payer: Self-pay

## 2018-01-24 DIAGNOSIS — K7030 Alcoholic cirrhosis of liver without ascites: Secondary | ICD-10-CM | POA: Diagnosis present

## 2018-01-24 DIAGNOSIS — K8020 Calculus of gallbladder without cholecystitis without obstruction: Secondary | ICD-10-CM | POA: Insufficient documentation

## 2019-11-28 IMAGING — US US ABDOMEN COMPLETE
1 series · 13 of 25 positions shown · non-contrast
Comparison: Ultrasound August 17, 2009.

CLINICAL DATA: Alcoholic cirrhosis of liver without ascites.

EXAM:
ABDOMEN ULTRASOUND COMPLETE

[Series 1: us abdomen complete · 0.20mm/px · 13 of 103 slices shown]
[im 1/103]
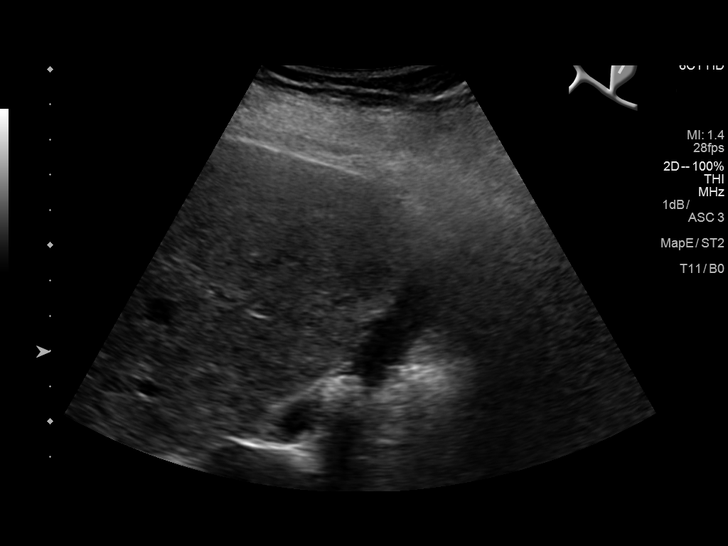
[im 9/103]
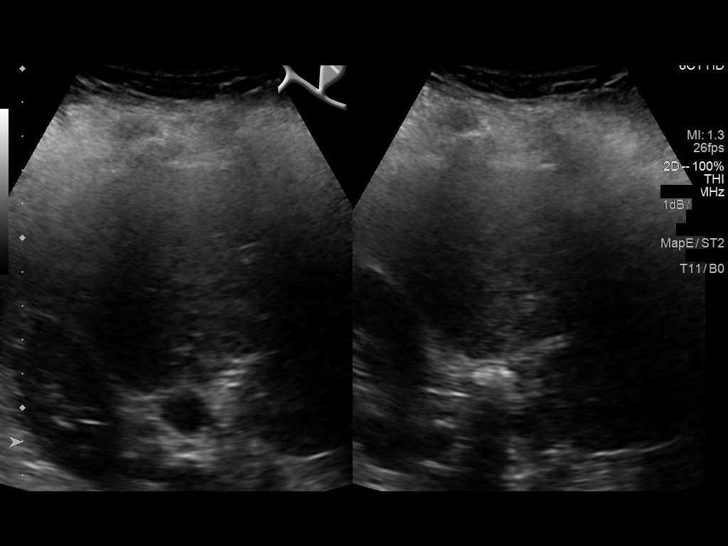
[im 18/103]
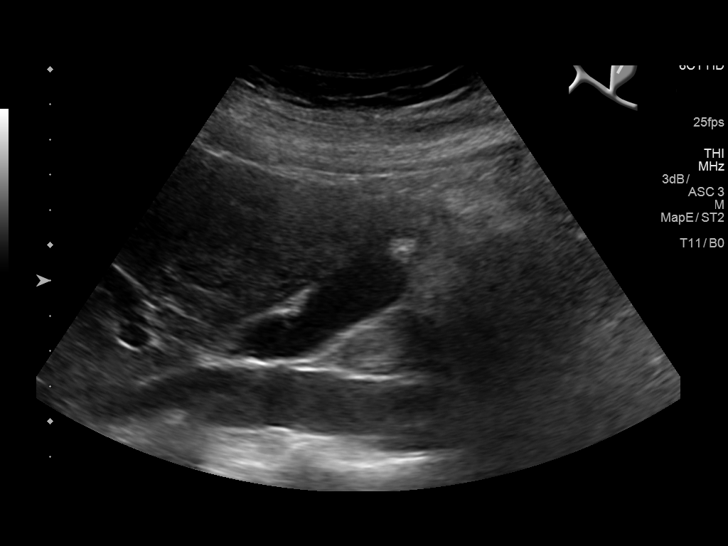
[im 26/103]
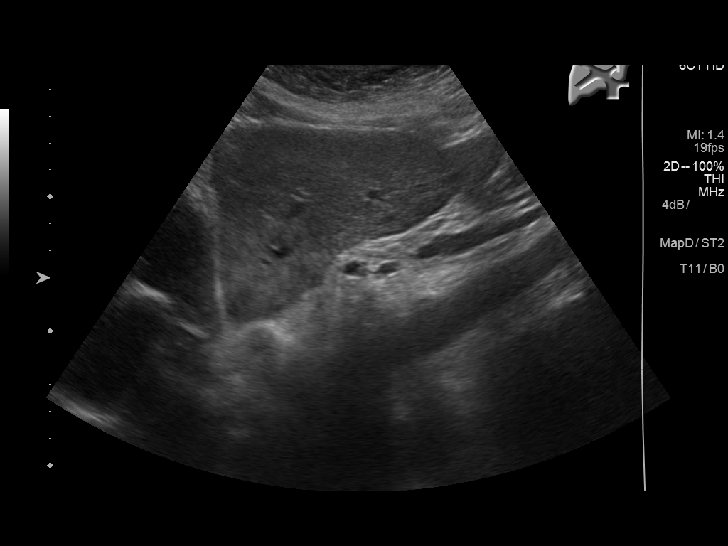
[im 35/103]
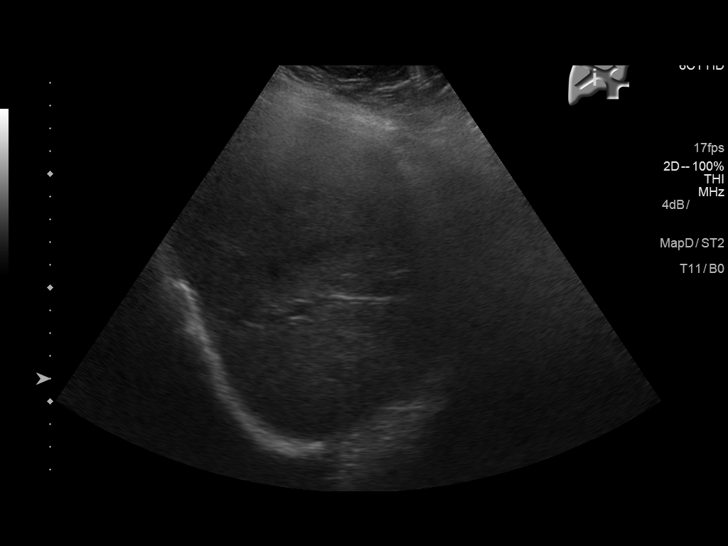
[im 43/103]
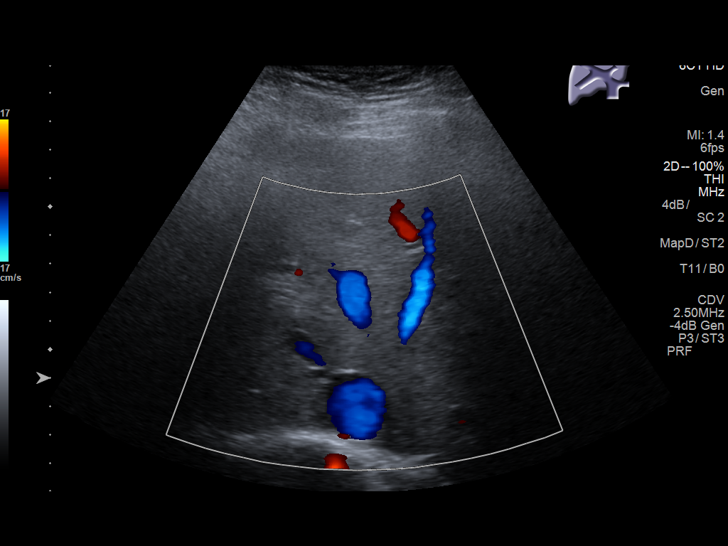
[im 52/103]
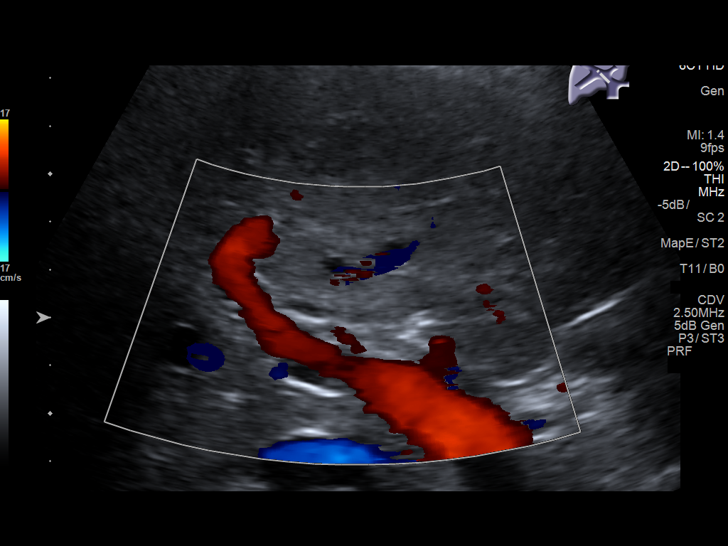
[im 60/103]
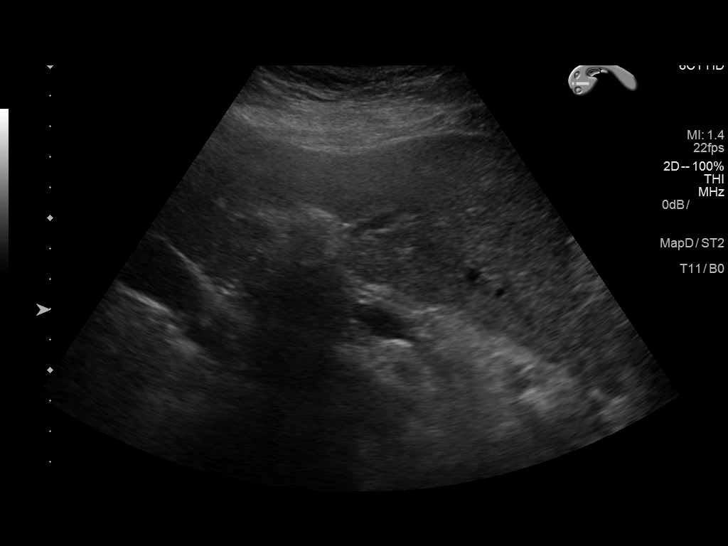
[im 69/103]
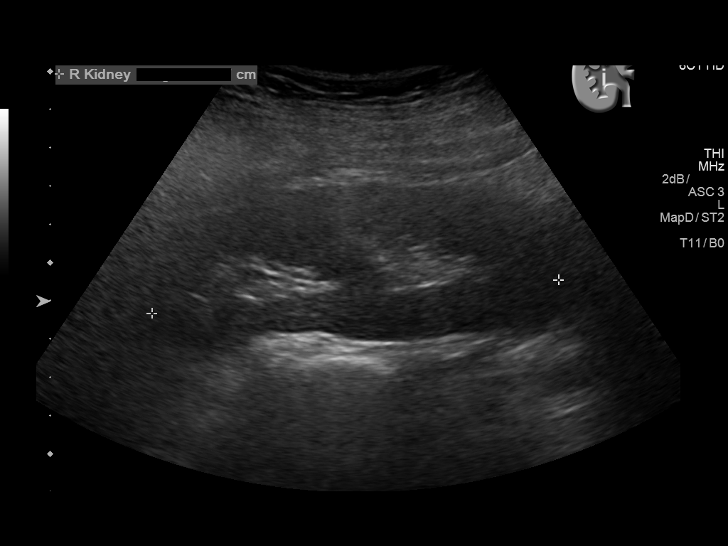
[im 77/103]
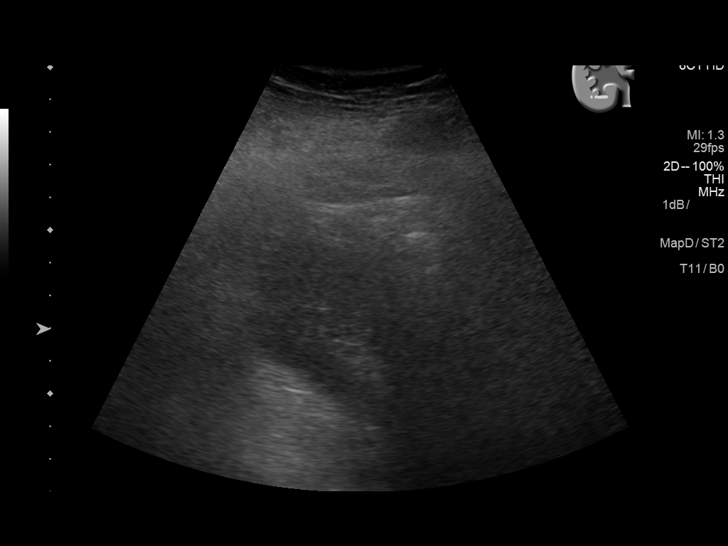
[im 86/103]
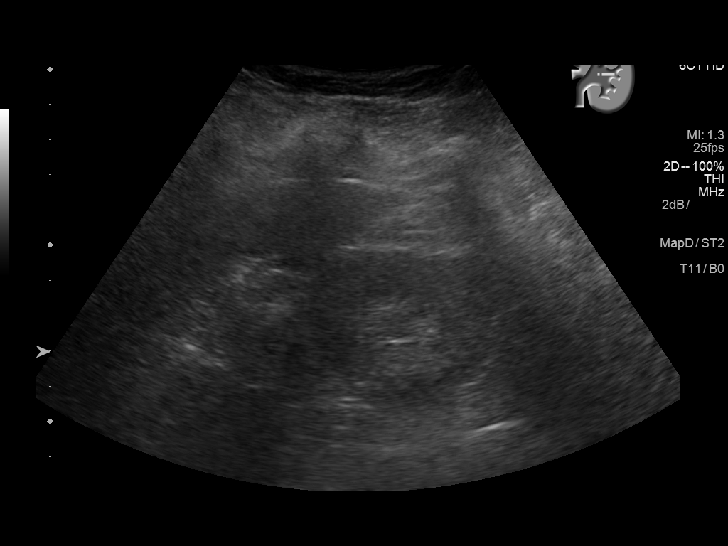
[im 94/103]
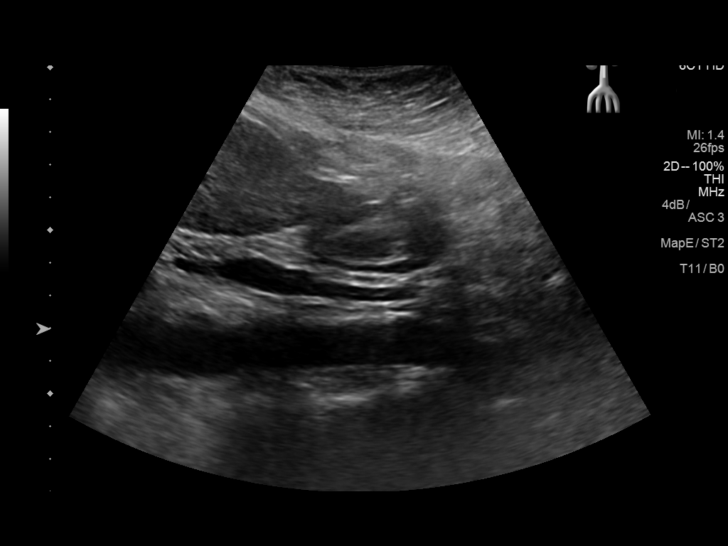
[im 103/103]
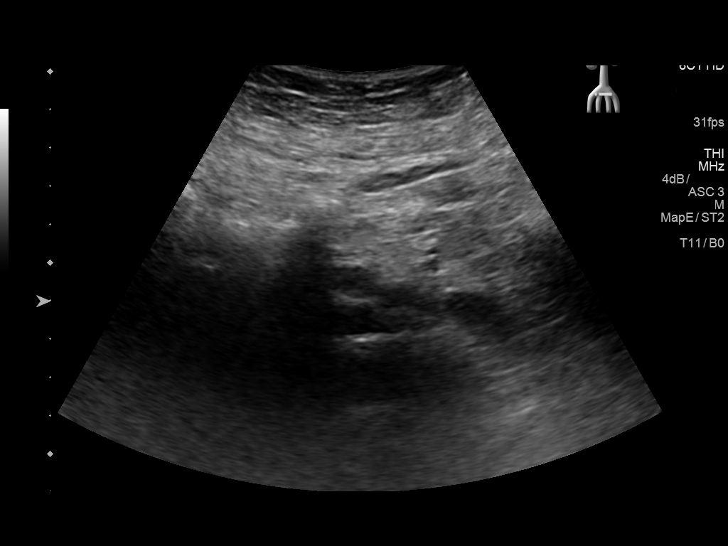

[13 of 25 positions shown; findings below may reference images not displayed]

FINDINGS: Gallbladder: 14 mm gallstone is noted. No gallbladder wall
thickening or pericholecystic fluid is noted. No sonographic
Murphy's sign is noted.

Common bile duct: Diameter: 3.7 mm which is within normal limits.

Liver: No focal lesion identified. Increased echogenicity of hepatic
parenchyma is noted. Portal vein is patent on color Doppler imaging
with normal direction of blood flow towards the liver.

IVC: No abnormality visualized.

Pancreas: Visualized portion unremarkable.

Spleen: Size and appearance within normal limits.

Right Kidney: Length: 10.7 cm. Echogenicity within normal limits. No
mass or hydronephrosis visualized.

Left Kidney: Length: 12.2 cm. Echogenicity within normal limits. No
mass or hydronephrosis visualized.

Abdominal aorta: No aneurysm visualized.

Other findings: None.
IMPRESSION: Cholelithiasis without evidence of cholecystitis.

Increased echogenicity of hepatic parenchyma is noted suggesting
fatty infiltration or other diffuse hepatocellular disease. No focal
sonographic abnormality is noted.

## 2021-11-01 ENCOUNTER — Other Ambulatory Visit: Payer: Self-pay | Admitting: Emergency Medicine

## 2021-11-01 DIAGNOSIS — Z1231 Encounter for screening mammogram for malignant neoplasm of breast: Secondary | ICD-10-CM

## 2022-08-09 ENCOUNTER — Other Ambulatory Visit: Payer: Self-pay | Admitting: Emergency Medicine

## 2022-08-09 DIAGNOSIS — Z87891 Personal history of nicotine dependence: Secondary | ICD-10-CM

## 2022-08-30 ENCOUNTER — Ambulatory Visit: Payer: 59

## 2022-09-20 ENCOUNTER — Ambulatory Visit: Admission: RE | Admit: 2022-09-20 | Payer: 59 | Source: Ambulatory Visit

## 2024-03-26 ENCOUNTER — Other Ambulatory Visit: Payer: Self-pay | Admitting: Nurse Practitioner

## 2024-03-26 DIAGNOSIS — Z1231 Encounter for screening mammogram for malignant neoplasm of breast: Secondary | ICD-10-CM
